# Patient Record
Sex: Male | Born: 1990 | Race: White | Hispanic: No | Marital: Single | Smoking: Current every day smoker
Health system: Southern US, Community
[De-identification: ages and names within clinical notes are randomized; demographics above are authoritative.]

## PROBLEM LIST (undated history)

## (undated) HISTORY — PX: TYMPANOSTOMY TUBE PLACEMENT: SHX32

---

## 2017-11-21 ENCOUNTER — Emergency Department (HOSPITAL_COMMUNITY)
Admission: EM | Admit: 2017-11-21 | Discharge: 2017-11-21 | Disposition: A | Discharge status: Home / Self Care | Payer: Self-pay | Attending: Emergency Medicine | Admitting: Emergency Medicine

## 2017-11-21 ENCOUNTER — Emergency Department (HOSPITAL_COMMUNITY): Payer: Self-pay

## 2017-11-21 ENCOUNTER — Encounter (HOSPITAL_COMMUNITY): Payer: Self-pay | Admitting: Emergency Medicine

## 2017-11-21 DIAGNOSIS — W230XXA Caught, crushed, jammed, or pinched between moving objects, initial encounter: Secondary | ICD-10-CM | POA: Insufficient documentation

## 2017-11-21 DIAGNOSIS — F17210 Nicotine dependence, cigarettes, uncomplicated: Secondary | ICD-10-CM | POA: Insufficient documentation

## 2017-11-21 DIAGNOSIS — Y939 Activity, unspecified: Secondary | ICD-10-CM | POA: Insufficient documentation

## 2017-11-21 DIAGNOSIS — Y999 Unspecified external cause status: Secondary | ICD-10-CM | POA: Insufficient documentation

## 2017-11-21 DIAGNOSIS — Y929 Unspecified place or not applicable: Secondary | ICD-10-CM | POA: Insufficient documentation

## 2017-11-21 DIAGNOSIS — S62664A Nondisplaced fracture of distal phalanx of right ring finger, initial encounter for closed fracture: Secondary | ICD-10-CM | POA: Insufficient documentation

## 2017-11-21 MED ORDER — IBUPROFEN 400 MG PO TABS
400.0000 mg | ORAL_TABLET | Freq: Once | ORAL | Status: AC
  Administered 2017-11-21: 400 mg via ORAL
  Filled 2017-11-21: qty 1

## 2017-11-21 MED ORDER — OXYCODONE-ACETAMINOPHEN 5-325 MG PO TABS: 1.0000 | ORAL_TABLET | Freq: Once | ORAL | Status: DC
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
## 2017-11-21 NOTE — Discharge Instructions (Addendum)
Please return to the Emergency Department for any new or worsening symptoms or if your symptoms do not improve. Please be sure to follow up with your Primary Care Physician as soon as possible regarding your visit today. If you do not have a Primary Doctor please use the resources below to establish one. Please call Dr. Eliberto Ivory office the hand surgeon as soon as possible to schedule appointment for next week. You may use the splint provided on an as-needed basis. Please take Ibuprofen (Advil, motrin) and Tylenol (acetaminophen) to relieve your pain.  You may take up to 400 MG (2 pills) of normal strength ibuprofen every 8 hours as needed.  In between doses of ibuprofen you make take tylenol, up to 500 mg (one extra strength pills).  Do not take more than 3,000 mg tylenol in a 24 hour period.  Please check all medication labels as many medications such as pain and cold medications may contain tylenol.  Do not drink alcohol while taking these medications.  Do not take other NSAID'S while taking ibuprofen (such as aleve or naproxen).  Please take ibuprofen with food to decrease stomach upset. Please also use rest, ice, compression and elevation to help with your pain.  Contact a doctor if: Medicine does not help your pain. You have more redness, swelling, or pain in the area of your wound. You have more fluid or blood coming from your wound. Your wound feels warm to the touch. You have pus coming from your wound. You continue to notice a bad smell coming from your wound or your bandage. Your wound that was closed breaks open. Get help right away if: You have a red streak going away from your wound. You have a fever. Contact a health care provider if: Your pain or swelling gets worse even with treatment. You have trouble moving your finger. Get help right away if: Your finger becomes numb or blue.  RESOURCE GUIDE  Chronic Pain Problems: Contact [facility]   [phone] Patients need to be referred by their primary care doctor.  Insufficient Money for Medicine: Contact United Way:  call "211" or [facility] 202-315-2077.  No Primary Care Doctor: Call [facility] Connect  [phone] - can help you locate a primary care doctor that  accepts your insurance, provides certain services, etc. Physician Referral [phone]  Agencies that provide inexpensive medical care: [facility]  [phone] [facility]  [phone] [facility]  [phone] [facility]  [phone] Planned [facility]  [phone] [facility]  [phone]  Medicaid-accepting [facility] Providers: [facility]- [address]  [phone], Mon-Fri 9am-7pm, Sat 9am-1pm [facility]- [address], Suite [place]  [phone] [facility]- [address], Suite [place]  [phone] [facility]- [address]  [phone] Renaye Rakers- [address] [place], [address], [phone]  Only accepts [place] patients after they have their name  applied to their card  Self Pay (no insurance) in [facility]: Sickle Cell Patients: Dr Willey Blade, [facility] Internal Medicine  [address] [place], [phone] [facility]- [address] [place]  [phone]       [facility] [place]- [address], [address]       -     [facility]- see information above (Speak to [employer]  H if you do not have insurance)       -  [facility]- [address] [place], [phone]       -  [facility]- [address],  [phone]       -  [facility]- [address] [phone]       -  Dr Julio Sicks-  [address], [place], [phone]       -  [facility]- [address], [phone]       -  [facility]- [address], [phone], also [address], [phone]       -    [facility]- [address] [place], [phone], 1st & 3rd Saturday   every month, 10am-1pm  1) Find a Doctor and Pay Out of Pocket Although you won't have to find out who is covered by your insurance plan, it is a good idea to ask around and get recommendations. You will then need to call the office and see if the doctor you have chosen will accept you as a new patient and what types of options they offer for patients who are self-pay. Some doctors offer discounts or will set up payment plans for their patients who do not have insurance, but you will need to ask so you aren't surprised when you get to your appointment.  2) Contact Your Local Health Department Not all health departments have doctors that can see patients for sick visits, but many do, so it is worth a call to see if yours does. If you don't know where your local health department is, you can check in your phone book. The CDC also has a tool to help you locate your state's health department, and many state websites also have listings of all of their local health departments.  3) Find a Walk-in Clinic If your illness is not likely to be very severe or complicated, you may want to try a walk in clinic. These are popping up all over the country in pharmacies, drugstores, and shopping centers. They're usually staffed by nurse practitioners or physician assistants that have been trained to treat common illnesses and complaints. They're usually fairly quick and inexpensive. However, if you have serious medical issues or chronic medical problems, these are probably not your best option  STD Testing [facility] Department of [facility], STD Clinic, [address] [place], phone [phone] or [phone].  Monday - Friday, call for an appointment. [facility] of [employer], STD Clinic, [address], [place], phone [phone] or [phone].  Monday -  Friday, call for an appointment.  Abuse/Neglect: [facility] Child Abuse Hotline 8040365275 [facility] Child Abuse Hotline (703) 796-7697 (After Hours)  Emergency Shelter:  [facility] 804-480-4975  Maternity Homes: [facility] [place] [facility] (215)709-4426 [facility] 770-176-5774  MRSA Hotline #:   (570)762-3548  [facility] Resources  [facility]  United Way [facility] Dept. [address]                 [address]         [address] [place] [address]  [address]  [place]                              [place] Phone:  [phone]                                  Phone:  [phone]                   Phone:  [phone]  [facility], [phone] [facility]- [phone]       -     [facility] in [place], [address],                                  [phone], [facility]  [place] Child Abuse Hotline 306 259 7730(336) 863-277-0130 or 909-368-4774(336) 317-175-2614 (After Hours)   Behavioral Health Services  Substance Abuse Resources: Alcohol and Drug Services  [phone] [facility] [phone] [facility] [phone]) [phone] [facility] [phone] Residential & Outpatient Substance Abuse Program  [phone]  Psychological Services: [facility]  [phone] [facility]  [phone] [facility], [phone] [place] [address], [place], ACCESS LINE: [phone] or (317)[phone], [email or web address]  Dental Assistance  If unable to pay or uninsured, contact:  [facility] or [facility] to become qualified for the adult dental clinic.  Patients with Medicaid: [facility] [phone], [phone] [address], [phone]  If unable to pay, or  uninsured, contact [facility] [phone]) or [facility] [phone] in [place], [phone] in [facility]) to become qualified for the adult dental clinic   Other Low-Cost Community Dental Services: [facility]- [address], [place], [place], [address], [phone], [address], 2nd and 4th Thursday of the month at 6:30am.  10 clients each day by appointment, can sometimes see walk-in patients if someone does not show for an appointment. [facility]- [address], [place], [place], [id number], [phone] [facility]- [address], [place], [place], [address], [phone] [facility]- [phone] [facility]- [phone] [phone]- [phone]

## 2017-11-21 NOTE — ED Notes (Signed)
E-signature not available, pt verbalized understanding of DC instructions  

## 2017-11-21 NOTE — ED Triage Notes (Signed)
Pt presents with inc pain to R ring finger. Pt states he fx the same finger 1.5 wks ago, was seen at hospital in [facility] for it. Pt states he is now having pain that is "shooting" up his R arm.

## 2017-11-30 ENCOUNTER — Inpatient Hospital Stay (INDEPENDENT_AMBULATORY_CARE_PROVIDER_SITE_OTHER): Payer: Self-pay | Admitting: Orthopaedic Surgery
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
